# Patient Record
Sex: Female | Born: 1935 | Race: White | Hispanic: No | Smoking: Never smoker
Health system: Southern US, Community
[De-identification: ages and names within clinical notes are randomized; demographics above are authoritative.]

## PROBLEM LIST (undated history)

## (undated) DIAGNOSIS — M810 Age-related osteoporosis without current pathological fracture: Secondary | ICD-10-CM

## (undated) DIAGNOSIS — M353 Polymyalgia rheumatica: Secondary | ICD-10-CM

## (undated) HISTORY — PX: ABDOMINAL HYSTERECTOMY: SHX81

## (undated) HISTORY — DX: Polymyalgia rheumatica: M35.3

## (undated) HISTORY — DX: Age-related osteoporosis without current pathological fracture: M81.0

---

## 2004-11-02 ENCOUNTER — Ambulatory Visit: Payer: Self-pay | Admitting: Internal Medicine

## 2004-11-04 ENCOUNTER — Ambulatory Visit: Payer: Self-pay | Admitting: Internal Medicine

## 2004-11-09 ENCOUNTER — Ambulatory Visit: Payer: Self-pay | Admitting: Internal Medicine

## 2005-03-04 ENCOUNTER — Ambulatory Visit: Payer: Self-pay | Admitting: Internal Medicine

## 2005-03-07 ENCOUNTER — Ambulatory Visit: Payer: Self-pay | Admitting: Internal Medicine

## 2012-09-05 ENCOUNTER — Ambulatory Visit (HOSPITAL_COMMUNITY)
Admission: RE | Admit: 2012-09-05 | Discharge: 2012-09-05 | Disposition: A | Discharge status: Home / Self Care | Payer: Medicare Other | Source: Ambulatory Visit | Attending: Rheumatology | Admitting: Rheumatology

## 2012-09-05 ENCOUNTER — Other Ambulatory Visit (HOSPITAL_COMMUNITY): Payer: Self-pay | Admitting: Rheumatology

## 2012-09-05 DIAGNOSIS — Z79899 Other long term (current) drug therapy: Secondary | ICD-10-CM | POA: Insufficient documentation

## 2012-09-05 DIAGNOSIS — Z5189 Encounter for other specified aftercare: Secondary | ICD-10-CM

## 2012-09-11 ENCOUNTER — Other Ambulatory Visit (HOSPITAL_BASED_OUTPATIENT_CLINIC_OR_DEPARTMENT_OTHER): Payer: Self-pay | Admitting: Rheumatology

## 2012-09-11 DIAGNOSIS — R700 Elevated erythrocyte sedimentation rate: Secondary | ICD-10-CM

## 2012-09-15 ENCOUNTER — Ambulatory Visit (HOSPITAL_BASED_OUTPATIENT_CLINIC_OR_DEPARTMENT_OTHER)
Admission: RE | Admit: 2012-09-15 | Discharge: 2012-09-15 | Disposition: A | Discharge status: Home / Self Care | Payer: Medicare Other | Source: Ambulatory Visit | Attending: Rheumatology | Admitting: Rheumatology

## 2012-09-15 ENCOUNTER — Emergency Department (HOSPITAL_BASED_OUTPATIENT_CLINIC_OR_DEPARTMENT_OTHER)
Admission: EM | Admit: 2012-09-15 | Discharge: 2012-09-15 | Discharge status: Against Medical Advice | Payer: Medicare Other

## 2012-09-15 DIAGNOSIS — N281 Cyst of kidney, acquired: Secondary | ICD-10-CM | POA: Insufficient documentation

## 2012-09-15 DIAGNOSIS — I700 Atherosclerosis of aorta: Secondary | ICD-10-CM | POA: Insufficient documentation

## 2012-09-15 DIAGNOSIS — J9819 Other pulmonary collapse: Secondary | ICD-10-CM | POA: Insufficient documentation

## 2012-09-15 DIAGNOSIS — Z90710 Acquired absence of both cervix and uterus: Secondary | ICD-10-CM | POA: Insufficient documentation

## 2012-09-15 DIAGNOSIS — K5730 Diverticulosis of large intestine without perforation or abscess without bleeding: Secondary | ICD-10-CM | POA: Insufficient documentation

## 2012-09-15 DIAGNOSIS — R700 Elevated erythrocyte sedimentation rate: Secondary | ICD-10-CM

## 2012-09-15 MED ORDER — IOHEXOL 300 MG/ML  SOLN
100.0000 mL | Freq: Once | INTRAMUSCULAR | Status: AC | PRN
  Administered 2012-09-15: 100 mL via INTRAVENOUS

## 2013-12-03 ENCOUNTER — Telehealth: Payer: Self-pay | Admitting: Hematology and Oncology

## 2013-12-03 NOTE — Telephone Encounter (Signed)
LEFT MESSAGE FOR PATIENT AND GAVE NP APPT FOR 10/26 @ 9:45 W/DR. Strang. CONTACT INFORMATION WAS LEFT FOR PATIENT TO RETURN CALL ON CONFIRM MESSAGE/NP APPT.

## 2013-12-16 ENCOUNTER — Encounter (INDEPENDENT_AMBULATORY_CARE_PROVIDER_SITE_OTHER): Payer: Self-pay

## 2013-12-16 ENCOUNTER — Encounter: Payer: Self-pay | Admitting: Hematology and Oncology

## 2013-12-16 ENCOUNTER — Ambulatory Visit (HOSPITAL_BASED_OUTPATIENT_CLINIC_OR_DEPARTMENT_OTHER): Payer: Commercial Managed Care - HMO | Admitting: Hematology and Oncology

## 2013-12-16 ENCOUNTER — Telehealth: Payer: Self-pay | Admitting: Hematology and Oncology

## 2013-12-16 ENCOUNTER — Ambulatory Visit: Payer: Commercial Managed Care - HMO

## 2013-12-16 VITALS — BP 147/66 | HR 75 | Temp 98.1°F | Resp 18 | Ht 60.0 in | Wt 111.7 lb

## 2013-12-16 DIAGNOSIS — D472 Monoclonal gammopathy: Secondary | ICD-10-CM

## 2013-12-16 DIAGNOSIS — M353 Polymyalgia rheumatica: Secondary | ICD-10-CM

## 2013-12-16 NOTE — Assessment & Plan Note (Addendum)
We discussed the importance of blood work, 24 hour urine collection and skeletal survey to be done at the end of the year rather than now. She has no clinical end organ damage. I will see her back after further tests results are available

## 2013-12-16 NOTE — Telephone Encounter (Signed)
Gave AVS & Cal for Jan. Also # for Xray if not contacted.

## 2013-12-16 NOTE — Progress Notes (Signed)
[facility] NOTE  Patient Care Team: Lynne Logan, MD as PCP - General (Family Medicine) Bo Merino, MD as Consulting Physician (Rheumatology) Heath Lark, MD as Consulting Physician (Hematology and Oncology)  CHIEF COMPLAINTS/PURPOSE OF CONSULTATION:  Abnormal M spike  HISTORY OF PRESENTING ILLNESS:  Stacey Merritt 80 y.o. female is here because of M spike detected on most recent blood work by her rheumatologist. She has background diagnosis of polymyalgia rheumatica and was placed on prednisone along with Plaquenil. The patient had chronic osteoporosis on Prolia injection. She denies history of abnormal bone pain or bone fracture. Patient denies history of recurrent infection or atypical infections such as shingles of meningitis. Denies chills, night sweats, anorexia or abnormal weight loss.  MEDICAL HISTORY:  Past Medical History  Diagnosis Date  . Polymyalgia rheumatica   . Osteoporosis     SURGICAL HISTORY: Past Surgical History  Procedure Laterality Date  . Abdominal hysterectomy      SOCIAL HISTORY: History   Social History  . Marital Status: Married    Spouse Name: N/A    Number of Children: N/A  . Years of Education: N/A   Occupational History  . Not on file.   Social History Main Topics  . Smoking status: Never Smoker   . Smokeless tobacco: Never Used  . Alcohol Use: No  . Drug Use: No  . Sexual Activity: Not on file   Other Topics Concern  . Not on file   Social History Narrative  . No narrative on file    FAMILY HISTORY: History reviewed. No pertinent family history.  ALLERGIES:  is allergic to celebrex and codeine.  MEDICATIONS:  Current Outpatient Prescriptions  Medication Sig Dispense Refill  . denosumab (PROLIA) 60 MG/ML SOLN injection Inject into the skin every 6 (six) months. Administer in upper arm, thigh, or abdomen      . hydroxychloroquine (PLAQUENIL) 200 MG tablet Take 200 mg by mouth daily.      .  predniSONE (DELTASONE) 5 MG tablet Take 8 mg by mouth daily with breakfast.       No current facility-administered medications for this visit.    REVIEW OF SYSTEMS:   Eyes: Denies blurriness of vision, double vision or watery eyes Ears, nose, mouth, throat, and face: Denies mucositis or sore throat Respiratory: Denies cough, dyspnea or wheezes Cardiovascular: Denies palpitation, chest discomfort or lower extremity swelling Gastrointestinal:  Denies nausea, heartburn or change in bowel habits Skin: Denies abnormal skin rashes Lymphatics: Denies new lymphadenopathy or easy bruising Neurological:Denies numbness, tingling or new weaknesses Behavioral/Psych: Mood is stable, no new changes  All other systems were reviewed with the patient and are negative.  PHYSICAL EXAMINATION: ECOG PERFORMANCE STATUS: 0 - Asymptomatic  Filed Vitals:   12/16/13 0941  BP: 147/66  Pulse: 75  Temp: 98.1 F (36.7 C)  Resp: 18   Filed Weights   12/16/13 0941  Weight: 111 lb 11.2 oz (50.667 kg)    GENERAL:alert, no distress and comfortable she looks thin and cachectic SKIN: skin color, texture, turgor are normal, no rashes or significant lesions EYES: normal, conjunctiva are pink and non-injected, sclera clear OROPHARYNX:no exudate, no erythema and lips, buccal mucosa, and tongue normal  NECK: supple, thyroid normal size, non-tender, without nodularity LYMPH:  no palpable lymphadenopathy in the cervical, axillary or inguinal LUNGS: clear to auscultation and percussion with normal breathing effort HEART: regular rate & rhythm and no murmurs and no lower extremity edema ABDOMEN:abdomen soft, non-tender and normal  bowel sounds Musculoskeletal:no cyanosis of digits and no clubbing  PSYCH: alert & oriented x 3 with fluent speech NEURO: no focal motor/sensory deficits  LABORATORY DATA: I review her outside blood work from September 2015   ASSESSMENT & PLAN:  MGUS (monoclonal gammopathy of unknown  significance) We discussed the importance of blood work, 24 hour urine collection and skeletal survey to be done at the end of the year rather than now. She has no clinical end organ damage. I will see her back after further tests results are available  Polymyalgia rheumatica She will continue management via her rheumatologist    Orders Placed This Encounter  Procedures  . DG Bone Survey Met    Standing Status: Future     Number of Occurrences:      Standing Expiration Date: 02/15/2015    Order Specific Question:  Reason for Exam (SYMPTOM  OR DIAGNOSIS REQUIRED)    Answer:  staging myeloma    Order Specific Question:  Preferred imaging location?    Answer:  [facility]  . CBC with Differential    Standing Status: Future     Number of Occurrences:      Standing Expiration Date: 02/15/2015  . Comprehensive metabolic panel    Standing Status: Future     Number of Occurrences:      Standing Expiration Date: 02/15/2015  . Lactate dehydrogenase    Standing Status: Future     Number of Occurrences:      Standing Expiration Date: 02/15/2015  . SPEP & IFE with QIG    Standing Status: Future     Number of Occurrences:      Standing Expiration Date: 02/15/2015  . Kappa/lambda light chains    Standing Status: Future     Number of Occurrences:      Standing Expiration Date: 02/15/2015  . Protein Electro, 24-Hour Urine    Standing Status: Future     Number of Occurrences:      Standing Expiration Date: 02/15/2015  . Beta 2 microglobulin, serum    Standing Status: Future     Number of Occurrences:      Standing Expiration Date: 02/15/2015  . Immunofixation interpretive, urine    Standing Status: Future     Number of Occurrences:      Standing Expiration Date: 02/15/2015  . IFE, Urine (with Tot Prot)    Standing Status: Future     Number of Occurrences:      Standing Expiration Date: 02/15/2015    All questions were answered. The patient knows to call the clinic  with any problems, questions or concerns. I spent 40 minutes counseling the patient face to face. The total time spent in the appointment was 55 minutes and more than 50% was on counseling.     [place], Hickman, MD 12/16/2013 12:40 PM

## 2013-12-16 NOTE — Assessment & Plan Note (Signed)
She will continue management via her rheumatologist

## 2013-12-16 NOTE — Progress Notes (Signed)
Checked in new pt with no financial concerns.  Pt is here for a hematology concern so no financial assistance may be needed but she has Raquel's card for any questions or concerns.

## 2014-02-24 ENCOUNTER — Ambulatory Visit (HOSPITAL_COMMUNITY): Payer: Commercial Managed Care - HMO

## 2014-02-24 ENCOUNTER — Other Ambulatory Visit (HOSPITAL_BASED_OUTPATIENT_CLINIC_OR_DEPARTMENT_OTHER): Payer: Medicare Other

## 2014-02-24 ENCOUNTER — Ambulatory Visit (HOSPITAL_COMMUNITY)
Admission: RE | Admit: 2014-02-24 | Discharge: 2014-02-24 | Disposition: A | Discharge status: Home / Self Care | Payer: Medicare Other | Source: Ambulatory Visit | Attending: Hematology and Oncology | Admitting: Hematology and Oncology

## 2014-02-24 DIAGNOSIS — D472 Monoclonal gammopathy: Secondary | ICD-10-CM

## 2014-02-24 DIAGNOSIS — M353 Polymyalgia rheumatica: Secondary | ICD-10-CM | POA: Insufficient documentation

## 2014-02-24 DIAGNOSIS — M5136 Other intervertebral disc degeneration, lumbar region: Secondary | ICD-10-CM | POA: Insufficient documentation

## 2014-02-24 DIAGNOSIS — M810 Age-related osteoporosis without current pathological fracture: Secondary | ICD-10-CM | POA: Diagnosis not present

## 2014-02-24 DIAGNOSIS — M5032 Other cervical disc degeneration, mid-cervical region: Secondary | ICD-10-CM | POA: Insufficient documentation

## 2014-02-24 LAB — CBC WITH DIFFERENTIAL/PLATELET
BASO%: 0.4 % (ref 0.0–2.0)
BASOS ABS: 0 10*3/uL (ref 0.0–0.1)
EOS%: 0 % (ref 0.0–7.0)
Eosinophils Absolute: 0 10*3/uL (ref 0.0–0.5)
HCT: 39.9 % (ref 34.8–46.6)
HGB: 13 g/dL (ref 11.6–15.9)
LYMPH%: 29.2 % (ref 14.0–49.7)
MCH: 30.5 pg (ref 25.1–34.0)
MCHC: 32.6 g/dL (ref 31.5–36.0)
MCV: 93.7 fL (ref 79.5–101.0)
MONO#: 0.5 10*3/uL (ref 0.1–0.9)
MONO%: 6.9 % (ref 0.0–14.0)
NEUT%: 63.5 % (ref 38.4–76.8)
NEUTROS ABS: 4.4 10*3/uL (ref 1.5–6.5)
PLATELETS: 259 10*3/uL (ref 145–400)
RBC: 4.26 10*6/uL (ref 3.70–5.45)
RDW: 14.9 % — ABNORMAL HIGH (ref 11.2–14.5)
WBC: 7 10*3/uL (ref 3.9–10.3)
lymph#: 2 10*3/uL (ref 0.9–3.3)

## 2014-02-24 LAB — COMPREHENSIVE METABOLIC PANEL (CC13)
ALBUMIN: 4.1 g/dL (ref 3.5–5.0)
ALK PHOS: 52 U/L (ref 40–150)
ALT: 20 U/L (ref 0–55)
AST: 22 U/L (ref 5–34)
Anion Gap: 12 mEq/L — ABNORMAL HIGH (ref 3–11)
BUN: 19.3 mg/dL (ref 7.0–26.0)
CALCIUM: 9.5 mg/dL (ref 8.4–10.4)
CO2: 27 mEq/L (ref 22–29)
Chloride: 102 mEq/L (ref 98–109)
Creatinine: 0.8 mg/dL (ref 0.6–1.1)
EGFR: 66 mL/min/{1.73_m2} — ABNORMAL LOW (ref >90)
Glucose: 88 mg/dl (ref 70–140)
Potassium: 3.7 mEq/L (ref 3.5–5.1)
SODIUM: 141 meq/L (ref 136–145)
Total Bilirubin: 0.41 mg/dL (ref 0.20–1.20)
Total Protein: 7.4 g/dL (ref 6.4–8.3)

## 2014-02-24 LAB — LACTATE DEHYDROGENASE (CC13): LDH: 265 U/L — AB (ref 125–245)

## 2014-02-26 LAB — KAPPA/LAMBDA LIGHT CHAINS
KAPPA FREE LGHT CHN: 0.2 mg/dL — AB (ref 0.33–1.94)
KAPPA LAMBDA RATIO: 0.25 — AB (ref 0.26–1.65)
LAMBDA FREE LGHT CHN: 0.81 mg/dL (ref 0.57–2.63)

## 2014-02-26 LAB — SPEP & IFE WITH QIG
ALPHA-2-GLOBULIN: 11.4 % (ref 7.1–11.8)
Albumin ELP: 61.4 % (ref 55.8–66.1)
Alpha-1-Globulin: 4.6 % (ref 2.9–4.9)
Beta 2: 4.1 % (ref 3.2–6.5)
Beta Globulin: 6.6 % (ref 4.7–7.2)
GAMMA GLOBULIN: 11.9 % (ref 11.1–18.8)
IGM, SERUM: 90 mg/dL (ref 52–322)
IgA: 106 mg/dL (ref 69–380)
IgG (Immunoglobin G), Serum: 890 mg/dL (ref 690–1700)
TOTAL PROTEIN, SERUM ELECTROPHOR: 7 g/dL (ref 6.0–8.3)

## 2014-02-26 LAB — BETA 2 MICROGLOBULIN, SERUM: BETA 2 MICROGLOBULIN: 2.41 mg/L (ref <=2.51)

## 2014-03-01 LAB — 24 HR URINE,KAPPA/LAMBDA LIGHT CHAINS
Measured Kappa Chain: <0.4 mg/dL (ref <2.00)
Measured Lambda Chain: <0.4 mg/dL (ref <2.00)
URINE VOLUME: 900 mL/(24.h)

## 2014-03-01 LAB — UIFE/LIGHT CHAINS/TP QN, 24-HR UR
Albumin, U: DETECTED
Time: 24 hours
Total Protein, Urine-Ur/day: 45 mg/d (ref <150)
Total Protein, Urine: 5 mg/dL (ref 5–24)
VOLUME, URINE-UPE24: 900 mL

## 2014-03-07 ENCOUNTER — Ambulatory Visit (HOSPITAL_BASED_OUTPATIENT_CLINIC_OR_DEPARTMENT_OTHER): Payer: Medicare Other | Admitting: Hematology and Oncology

## 2014-03-07 ENCOUNTER — Encounter: Payer: Self-pay | Admitting: Hematology and Oncology

## 2014-03-07 ENCOUNTER — Telehealth: Payer: Self-pay | Admitting: Hematology and Oncology

## 2014-03-07 VITALS — BP 150/67 | HR 69 | Temp 97.8°F | Resp 18 | Ht 60.0 in | Wt 114.1 lb

## 2014-03-07 DIAGNOSIS — D472 Monoclonal gammopathy: Secondary | ICD-10-CM

## 2014-03-07 DIAGNOSIS — M810 Age-related osteoporosis without current pathological fracture: Secondary | ICD-10-CM

## 2014-03-07 NOTE — Telephone Encounter (Signed)
Pt confirmed labs/ov per 01/15 POF, gave pt AVS.... KJ

## 2014-03-07 NOTE — Progress Notes (Signed)
[place] OFFICE PROGRESS NOTE  Patient Care Team: Lynne Logan, MD as PCP - General (Family Medicine) Bo Merino, MD as Consulting Physician (Rheumatology) Heath Lark, MD as Consulting Physician (Hematology and Oncology)  SUMMARY OF ONCOLOGIC HISTORY: Stacey Merritt 81 y.o. female is here because of M spike detected on most recent blood work by her rheumatologist. She has background diagnosis of polymyalgia rheumatica and was placed on prednisone along with Plaquenil. The patient had chronic osteoporosis on Prolia injection. She denies history of abnormal bone pain or bone fracture. Patient denies history of recurrent infection or atypical infections such as shingles of meningitis. Denies chills, night sweats, anorexia or abnormal weight loss. In January 2016, repeat blood work, 24-hour urine collection and skeletal survey were normal  INTERVAL HISTORY: Please see below for problem oriented charting. She feels well. She continued to have intermittent musculoskeletal pain especially over her right shoulder  REVIEW OF SYSTEMS:   Constitutional: Denies fevers, chills or abnormal weight loss Eyes: Denies blurriness of vision Ears, nose, mouth, throat, and face: Denies mucositis or sore throat Respiratory: Denies cough, dyspnea or wheezes Cardiovascular: Denies palpitation, chest discomfort or lower extremity swelling Gastrointestinal:  Denies nausea, heartburn or change in bowel habits Skin: Denies abnormal skin rashes Lymphatics: Denies new lymphadenopathy or easy bruising Neurological:Denies numbness, tingling or new weaknesses Behavioral/Psych: Mood is stable, no new changes  All other systems were reviewed with the patient and are negative.  I have reviewed the past medical history, past surgical history, social history and family history with the patient and they are unchanged from previous note.  ALLERGIES:  is allergic to celebrex and  codeine.  MEDICATIONS:  Current Outpatient Prescriptions  Medication Sig Dispense Refill  . denosumab (PROLIA) 60 MG/ML SOLN injection Inject into the skin every 6 (six) months. Administer in upper arm, thigh, or abdomen    . hydroxychloroquine (PLAQUENIL) 200 MG tablet Take 200 mg by mouth daily.    . predniSONE (DELTASONE) 5 MG tablet Take 8 mg by mouth daily with breakfast.     No current facility-administered medications for this visit.    PHYSICAL EXAMINATION: ECOG PERFORMANCE STATUS: 0 - Asymptomatic  Filed Vitals:   03/07/14 1048  BP: 150/67  Pulse: 69  Temp: 97.8 F (36.6 C)  Resp: 18   Filed Weights   03/07/14 1048  Weight: 114 lb 1.6 oz (51.755 kg)    GENERAL:alert, no distress and comfortable SKIN: skin color, texture, turgor are normal, no rashes or significant lesions EYES: normal, Conjunctiva are pink and non-injected, sclera clear Musculoskeletal:no cyanosis of digits and no clubbing  NEURO: alert & oriented x 3 with fluent speech, no focal motor/sensory deficits  LABORATORY DATA:  I have reviewed the data as listed    Component Value Date/Time   NA 141 02/24/2014 1144   K 3.7 02/24/2014 1144   CO2 27 02/24/2014 1144   GLUCOSE 88 02/24/2014 1144   BUN 19.3 02/24/2014 1144   CREATININE 0.8 02/24/2014 1144   CALCIUM 9.5 02/24/2014 1144   PROT 7.4 02/24/2014 1144   ALBUMIN 4.1 02/24/2014 1144   AST 22 02/24/2014 1144   ALT 20 02/24/2014 1144   ALKPHOS 52 02/24/2014 1144   BILITOT 0.41 02/24/2014 1144    No results found for: SPEP, UPEP  Lab Results  Component Value Date   WBC 7.0 02/24/2014   NEUTROABS 4.4 02/24/2014   HGB 13.0 02/24/2014   HCT 39.9 02/24/2014   MCV 93.7 02/24/2014  PLT 259 02/24/2014      Chemistry      Component Value Date/Time   NA 141 02/24/2014 1144   K 3.7 02/24/2014 1144   CO2 27 02/24/2014 1144   BUN 19.3 02/24/2014 1144   CREATININE 0.8 02/24/2014 1144      Component Value Date/Time   CALCIUM 9.5  02/24/2014 1144   ALKPHOS 52 02/24/2014 1144   AST 22 02/24/2014 1144   ALT 20 02/24/2014 1144   BILITOT 0.41 02/24/2014 1144       RADIOGRAPHIC STUDIES: I reviewed the most recent skeletal survey with her and her daughter I have personally reviewed the radiological images as listed and agreed with the findings in the report.    ASSESSMENT & PLAN:  MGUS (monoclonal gammopathy of unknown significance) All her repeat blood work, urine tests and skeletal survey did not confirm presence of monoclonal paraproteinemia. However, this was detected last year by her rheumatologist. Recommend recheck blood work and imaging study a year from now and she agreed.   Osteoporosis On the skeletal survey, it detected a small lesion in the humerus. I reviewed it myself and I do not think this is a lytic lesion. I noted she has significant osteoporosis from long-term prednisone therapy. Recommend she increase the dose of vitamin D and calcium supplements. She is getting Prolia on the regular basis    Orders Placed This Encounter  Procedures  . DG Bone Survey Met    Standing Status: Future     Number of Occurrences:      Standing Expiration Date: 05/07/2015    Order Specific Question:  Reason for Exam (SYMPTOM  OR DIAGNOSIS REQUIRED)    Answer:  staging myeloma    Order Specific Question:  Preferred imaging location?    Answer:  [facility]  . CBC with Differential    Standing Status: Future     Number of Occurrences:      Standing Expiration Date: 05/07/2015  . Comprehensive metabolic panel    Standing Status: Future     Number of Occurrences:      Standing Expiration Date: 05/07/2015  . Lactate dehydrogenase    Standing Status: Future     Number of Occurrences:      Standing Expiration Date: 05/07/2015  . SPEP & IFE with QIG    Standing Status: Future     Number of Occurrences:      Standing Expiration Date: 05/07/2015  . Kappa/lambda light chains    Standing Status: Future      Number of Occurrences:      Standing Expiration Date: 05/07/2015  . Beta 2 microglobulin, serum    Standing Status: Future     Number of Occurrences:      Standing Expiration Date: 05/07/2015   All questions were answered. The patient knows to call the clinic with any problems, questions or concerns. No barriers to learning was detected. I spent 15 minutes counseling the patient face to face. The total time spent in the appointment was 20 minutes and more than 50% was on counseling and review of test results     [facility], [place], MD 03/07/2014 4:51 PM

## 2014-03-07 NOTE — Assessment & Plan Note (Signed)
On the skeletal survey, it detected a small lesion in the humerus. I reviewed it myself and I do not think this is a lytic lesion. I noted she has significant osteoporosis from long-term prednisone therapy. Recommend she increase the dose of vitamin D and calcium supplements. She is getting Prolia on the regular basis

## 2014-03-07 NOTE — Assessment & Plan Note (Signed)
All her repeat blood work, urine tests and skeletal survey did not confirm presence of monoclonal paraproteinemia. However, this was detected last year by her rheumatologist. Recommend recheck blood work and imaging study a year from now and she agreed.

## 2014-08-21 ENCOUNTER — Encounter: Payer: Self-pay | Admitting: Internal Medicine

## 2015-02-24 ENCOUNTER — Ambulatory Visit (HOSPITAL_COMMUNITY)
Admission: RE | Admit: 2015-02-24 | Discharge: 2015-02-24 | Disposition: A | Discharge status: Home / Self Care | Payer: Medicare Other | Source: Ambulatory Visit | Attending: Hematology and Oncology | Admitting: Hematology and Oncology

## 2015-02-24 DIAGNOSIS — I700 Atherosclerosis of aorta: Secondary | ICD-10-CM | POA: Diagnosis not present

## 2015-02-24 DIAGNOSIS — M899 Disorder of bone, unspecified: Secondary | ICD-10-CM | POA: Diagnosis not present

## 2015-02-24 DIAGNOSIS — D472 Monoclonal gammopathy: Secondary | ICD-10-CM | POA: Insufficient documentation

## 2015-02-24 DIAGNOSIS — M8580 Other specified disorders of bone density and structure, unspecified site: Secondary | ICD-10-CM | POA: Diagnosis not present

## 2015-02-26 ENCOUNTER — Telehealth: Payer: Self-pay | Admitting: Hematology and Oncology

## 2015-02-26 ENCOUNTER — Telehealth: Payer: Self-pay | Admitting: *Deleted

## 2015-02-26 ENCOUNTER — Other Ambulatory Visit: Payer: Medicare Other

## 2015-02-26 NOTE — Telephone Encounter (Signed)
Returned patient call re r/s 1/5 and 1/12 appointments. Per patient she needs appointments on a Tuesday. Gave patient new appointments for 1/10 and 1/17.

## 2015-02-26 NOTE — Telephone Encounter (Signed)
Pt called to r/s her lab and MD visit.  States she cannot come on Thursdays and needs to r/s to Tuesdays d/t caregiving needs of her husband who has Alzheimers.  Informed pt we can move her lab to 1/10 and MD visit to 1/17 and she also requests between 10 am and 12 pm if possible.  POF sent to scheduler and informed pt Scheduler will call her w/ new appt date/ time.

## 2015-03-03 ENCOUNTER — Ambulatory Visit (HOSPITAL_BASED_OUTPATIENT_CLINIC_OR_DEPARTMENT_OTHER): Payer: Medicare Other

## 2015-03-03 DIAGNOSIS — D472 Monoclonal gammopathy: Secondary | ICD-10-CM

## 2015-03-03 LAB — CBC WITH DIFFERENTIAL/PLATELET
BASO%: 0.4 % (ref 0.0–2.0)
Basophils Absolute: 0 10*3/uL (ref 0.0–0.1)
EOS ABS: 0.2 10*3/uL (ref 0.0–0.5)
EOS%: 3.1 % (ref 0.0–7.0)
HEMATOCRIT: 37.8 % (ref 34.8–46.6)
HEMOGLOBIN: 12.7 g/dL (ref 11.6–15.9)
LYMPH%: 33.1 % (ref 14.0–49.7)
MCH: 31.9 pg (ref 25.1–34.0)
MCHC: 33.6 g/dL (ref 31.5–36.0)
MCV: 95 fL (ref 79.5–101.0)
MONO#: 0.5 10*3/uL (ref 0.1–0.9)
MONO%: 9.9 % (ref 0.0–14.0)
NEUT#: 2.6 10*3/uL (ref 1.5–6.5)
NEUT%: 53.5 % (ref 38.4–76.8)
Platelets: 205 10*3/uL (ref 145–400)
RBC: 3.98 10*6/uL (ref 3.70–5.45)
RDW: 12.2 % (ref 11.2–14.5)
WBC: 4.9 10*3/uL (ref 3.9–10.3)
lymph#: 1.6 10*3/uL (ref 0.9–3.3)

## 2015-03-03 LAB — COMPREHENSIVE METABOLIC PANEL
ALBUMIN: 3.9 g/dL (ref 3.5–5.0)
ALK PHOS: 51 U/L (ref 40–150)
ALT: 14 U/L (ref 0–55)
AST: 20 U/L (ref 5–34)
Anion Gap: 9 mEq/L (ref 3–11)
BILIRUBIN TOTAL: 0.46 mg/dL (ref 0.20–1.20)
BUN: 20.9 mg/dL (ref 7.0–26.0)
CO2: 27 mEq/L (ref 22–29)
Calcium: 9.8 mg/dL (ref 8.4–10.4)
Chloride: 105 mEq/L (ref 98–109)
Creatinine: 0.8 mg/dL (ref 0.6–1.1)
EGFR: 70 mL/min/{1.73_m2} — ABNORMAL LOW (ref >90)
GLUCOSE: 89 mg/dL (ref 70–140)
Potassium: 4.2 mEq/L (ref 3.5–5.1)
SODIUM: 140 meq/L (ref 136–145)
TOTAL PROTEIN: 7.1 g/dL (ref 6.4–8.3)

## 2015-03-03 LAB — LACTATE DEHYDROGENASE: LDH: 241 U/L (ref 125–245)

## 2015-03-04 LAB — KAPPA/LAMBDA LIGHT CHAINS
IG LAMBDA FREE LIGHT CHAIN: 11.21 mg/L (ref 5.71–26.30)
Ig Kappa Free Light Chain: 12.18 mg/L (ref 3.30–19.40)
KAPPA/LAMBDA FLC RATIO: 1.09 (ref 0.26–1.65)

## 2015-03-04 LAB — BETA 2 MICROGLOBULIN, SERUM: BETA 2: 1.9 mg/L (ref 0.6–2.4)

## 2015-03-05 ENCOUNTER — Ambulatory Visit: Payer: Medicare Other | Admitting: Hematology and Oncology

## 2015-03-10 ENCOUNTER — Ambulatory Visit (HOSPITAL_BASED_OUTPATIENT_CLINIC_OR_DEPARTMENT_OTHER): Payer: Medicare Other | Admitting: Hematology and Oncology

## 2015-03-10 ENCOUNTER — Encounter: Payer: Self-pay | Admitting: Hematology and Oncology

## 2015-03-10 VITALS — BP 124/67 | HR 63 | Temp 98.0°F | Resp 18 | Ht 60.0 in | Wt 109.3 lb

## 2015-03-10 DIAGNOSIS — D472 Monoclonal gammopathy: Secondary | ICD-10-CM | POA: Diagnosis not present

## 2015-03-10 NOTE — Progress Notes (Signed)
[place] OFFICE PROGRESS NOTE  Patient Care Team: Donald Prose, MD as PCP - General (Family Medicine) Bo Merino, MD as Consulting Physician (Rheumatology) Heath Lark, MD as Consulting Physician (Hematology and Oncology)  SUMMARY OF ONCOLOGIC HISTORY:  Stacey Merritt is here because of M spike detected on most recent blood work by her rheumatologist. She has background diagnosis of polymyalgia rheumatica and was placed on prednisone along with Plaquenil. The patient had chronic osteoporosis on Prolia injection. She denies history of abnormal bone pain or bone fracture. Patient denies history of recurrent infection or atypical infections such as shingles of meningitis. Denies chills, night sweats, anorexia or abnormal weight loss. In January 2016, repeat blood work, 24-hour urine collection and skeletal survey were normal  INTERVAL HISTORY:  Please see below for problem oriented charting. She feels well. Denies any bone pain or recent bone fracture. She is off prednisone and is only taking medicine for osteoporosis.  REVIEW OF SYSTEMS:   Constitutional: Denies fevers, chills or abnormal weight loss Eyes: Denies blurriness of vision Ears, nose, mouth, throat, and face: Denies mucositis or sore throat Respiratory: Denies cough, dyspnea or wheezes Cardiovascular: Denies palpitation, chest discomfort or lower extremity swelling Gastrointestinal:  Denies nausea, heartburn or change in bowel habits Skin: Denies abnormal skin rashes Lymphatics: Denies new lymphadenopathy or easy bruising Neurological:Denies numbness, tingling or new weaknesses Behavioral/Psych: Mood is stable, no new changes  All other systems were reviewed with the patient and are negative.  I have reviewed the past medical history, past surgical history, social history and family history with the patient and they are unchanged from previous note.  ALLERGIES:  is allergic to celebrex and  codeine.  MEDICATIONS:  Current Outpatient Prescriptions  Medication Sig Dispense Refill  . denosumab (PROLIA) 60 MG/ML SOLN injection Inject into the skin every 6 (six) months. Administer in upper arm, thigh, or abdomen     No current facility-administered medications for this visit.    PHYSICAL EXAMINATION: ECOG PERFORMANCE STATUS: 0 - Asymptomatic  Filed Vitals:   03/10/15 1214  BP: 124/67  Pulse: 63  Temp: 98 F (36.7 C)  Resp: 18   Filed Weights   03/10/15 1214  Weight: 109 lb 4.8 oz (49.578 kg)    GENERAL:alert, no distress and comfortable SKIN: skin color, texture, turgor are normal, no rashes or significant lesions EYES: normal, Conjunctiva are pink and non-injected, sclera clear Musculoskeletal:no cyanosis of digits and no clubbing  NEURO: alert & oriented x 3 with fluent speech, no focal motor/sensory deficits  LABORATORY DATA:  I have reviewed the data as listed    Component Value Date/Time   NA 140 03/03/2015 1135   K 4.2 03/03/2015 1135   CO2 27 03/03/2015 1135   GLUCOSE 89 03/03/2015 1135   BUN 20.9 03/03/2015 1135   CREATININE 0.8 03/03/2015 1135   CALCIUM 9.8 03/03/2015 1135   PROT 7.1 03/03/2015 1135   ALBUMIN 3.9 03/03/2015 1135   AST 20 03/03/2015 1135   ALT 14 03/03/2015 1135   ALKPHOS 51 03/03/2015 1135   BILITOT 0.46 03/03/2015 1135    No results found for: SPEP, UPEP  Lab Results  Component Value Date   WBC 4.9 03/03/2015   NEUTROABS 2.6 03/03/2015   HGB 12.7 03/03/2015   HCT 37.8 03/03/2015   MCV 95.0 03/03/2015   PLT 205 03/03/2015      Chemistry      Component Value Date/Time   NA 140 03/03/2015 1135   K 4.2  03/03/2015 1135   CO2 27 03/03/2015 1135   BUN 20.9 03/03/2015 1135   CREATININE 0.8 03/03/2015 1135      Component Value Date/Time   CALCIUM 9.8 03/03/2015 1135   ALKPHOS 51 03/03/2015 1135   AST 20 03/03/2015 1135   ALT 14 03/03/2015 1135   BILITOT 0.46 03/03/2015 1135       RADIOGRAPHIC STUDIES:  Skeletal survey showed no evidence of lytic lesions I have personally reviewed the radiological images as listed and agreed with the findings in the report.    ASSESSMENT & PLAN:  MGUS (monoclonal gammopathy of unknown significance) All her repeat blood work, urine tests and skeletal survey did not confirm presence of monoclonal paraproteinemia. However, this was detected in 2015 by her rheumatologist. Recommend recheck blood work and imaging study recently were negative The final report from serum protein electrophoresis is still pending. I will call the patient with results. If repeat test confirm she have no evidence of monoclonal paraproteinemia, I will discharge her from the clinic. She agrees with the plan of care.    No orders of the defined types were placed in this encounter.   All questions were answered. The patient knows to call the clinic with any problems, questions or concerns. No barriers to learning was detected. I spent 15 minutes counseling the patient face to face. The total time spent in the appointment was 20 minutes and more than 50% was on counseling and review of test results     [facility], Mattie Novosel, MD 03/10/2015 1:13 PM

## 2015-03-10 NOTE — Assessment & Plan Note (Signed)
All her repeat blood work, urine tests and skeletal survey did not confirm presence of monoclonal paraproteinemia. However, this was detected in 2015 by her rheumatologist. Recommend recheck blood work and imaging study recently were negative The final report from serum protein electrophoresis is still pending. I will call the patient with results. If repeat test confirm she have no evidence of monoclonal paraproteinemia, I will discharge her from the clinic. She agrees with the plan of care.

## 2015-03-11 ENCOUNTER — Telehealth: Payer: Self-pay | Admitting: *Deleted

## 2015-03-11 NOTE — Telephone Encounter (Signed)
Informed pt that the rest of her labs came back normal per Dr. Alvy Bimler.  She verbalized understanding.

## 2015-03-17 LAB — IMMUNOFIXATION ELECTROPHORESIS
IgA, Qn, Serum: 83 mg/dL (ref 64–422)
IgG, Qn, Serum: 786 mg/dL (ref 700–1600)
IgM, Qn, Serum: 74 mg/dL (ref 26–217)

## 2016-01-19 IMAGING — CR DG BONE SURVEY MET
10 series · 10 of 10 positions shown · non-contrast
Comparison: 02/24/2014.  CT Abdomen and Pelvis 09/15/2012.

CLINICAL DATA: 81-year-old female with Monoclonal gammopathy of
unknown significance. Staging. Subsequent encounter.

EXAM:
METASTATIC BONE SURVEY

[w chest pa]
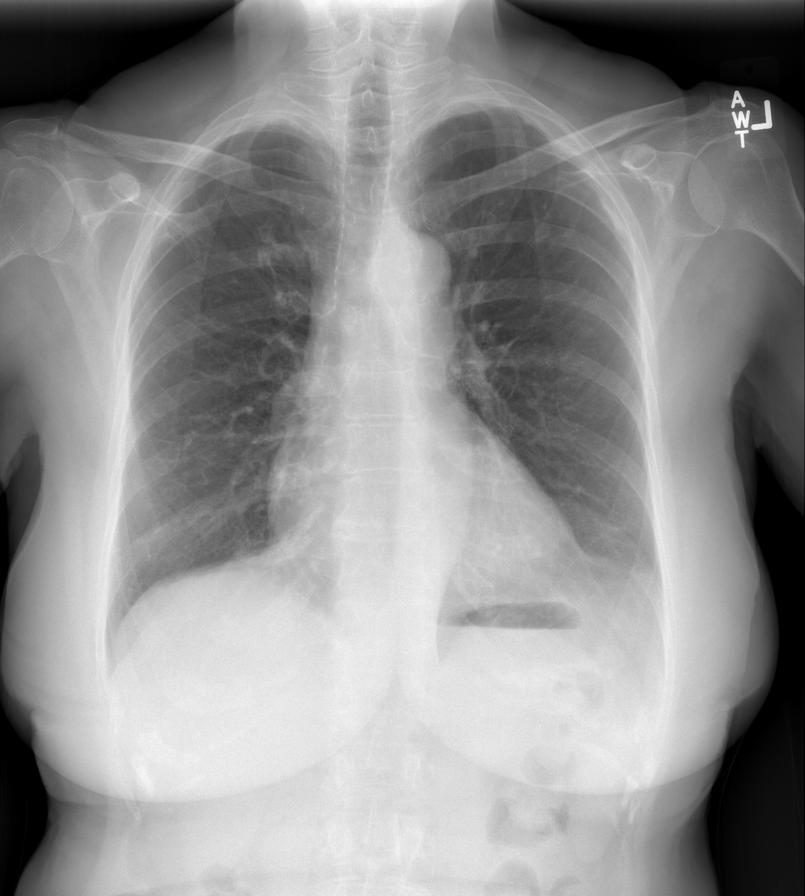

[w c-spine lat]
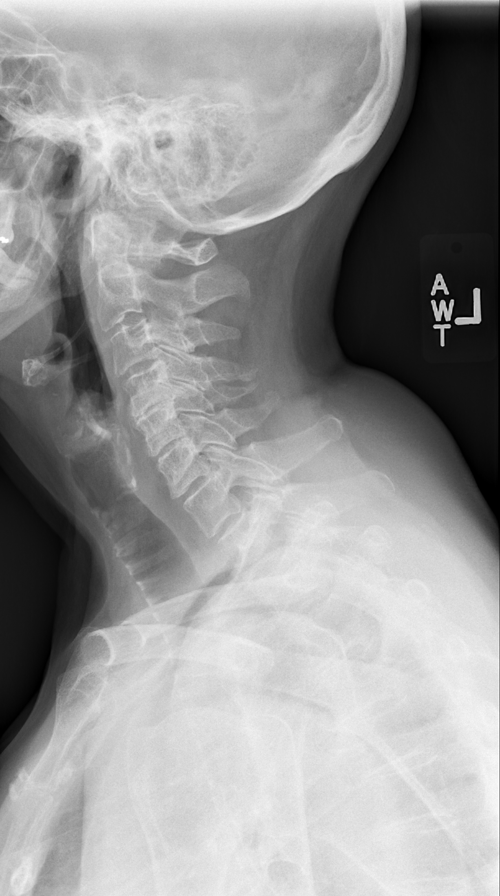

[w skull lat *]
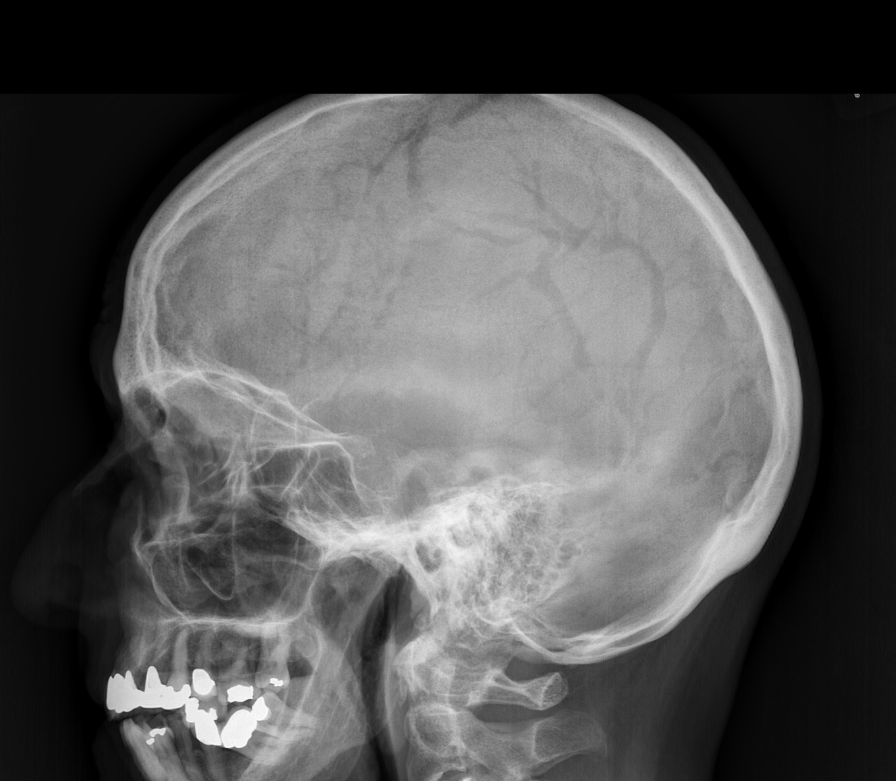

[t c-spine a.p.]
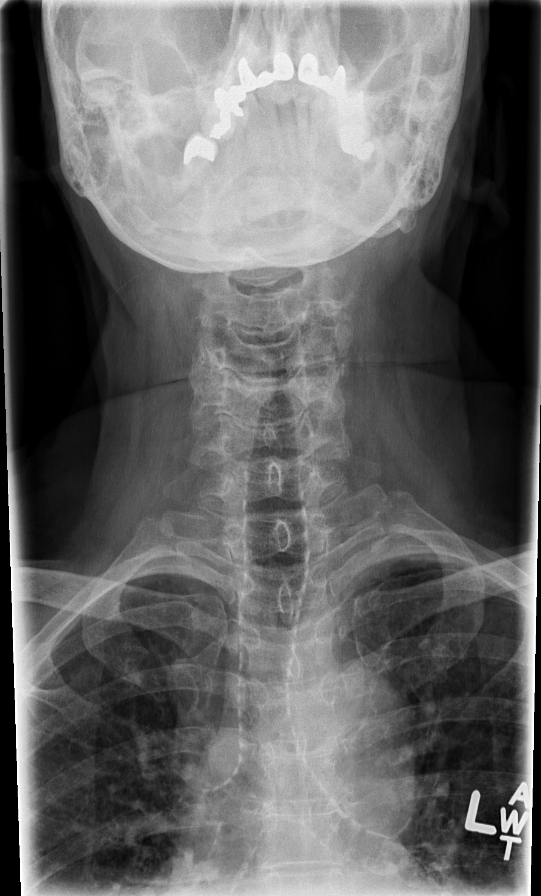

[t t-spine a.p.]
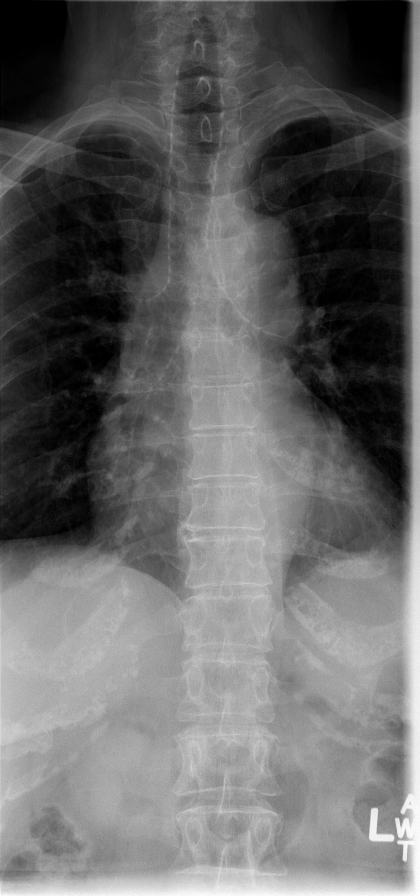

[t l-spine a.p.]
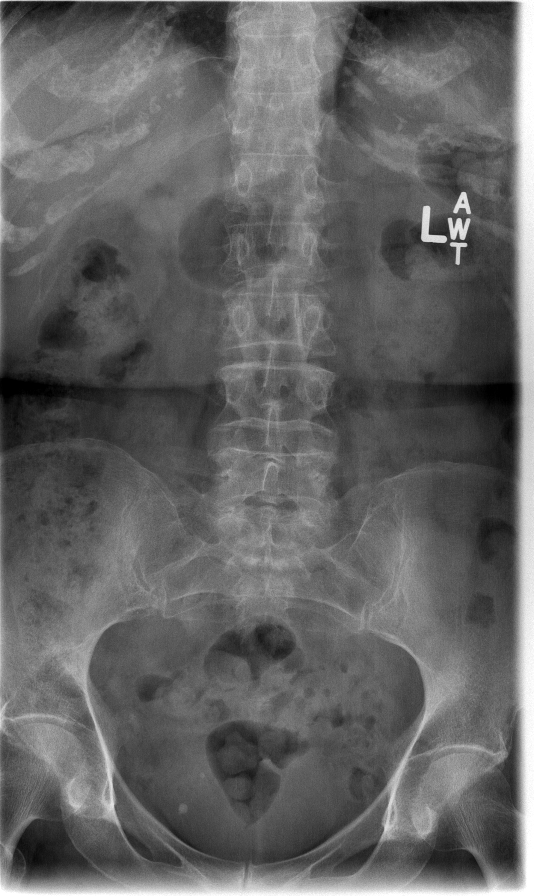

[t pelvis a.p.]
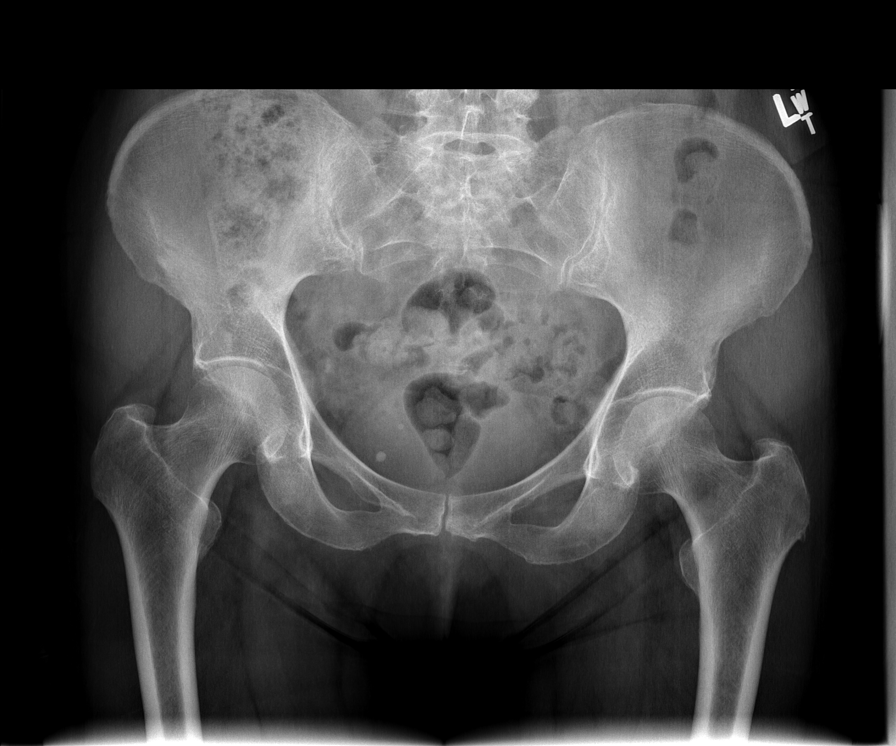

[t femur with hip  ap left]
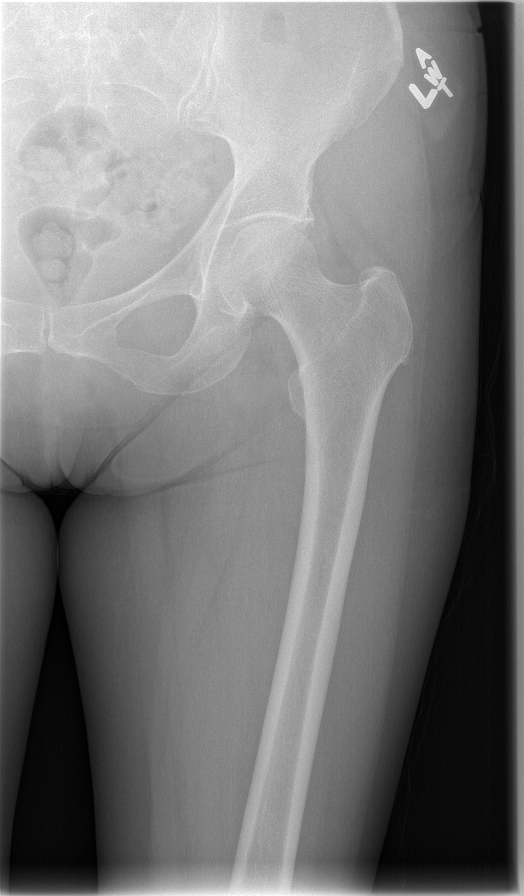

[t femur with knee ap left]
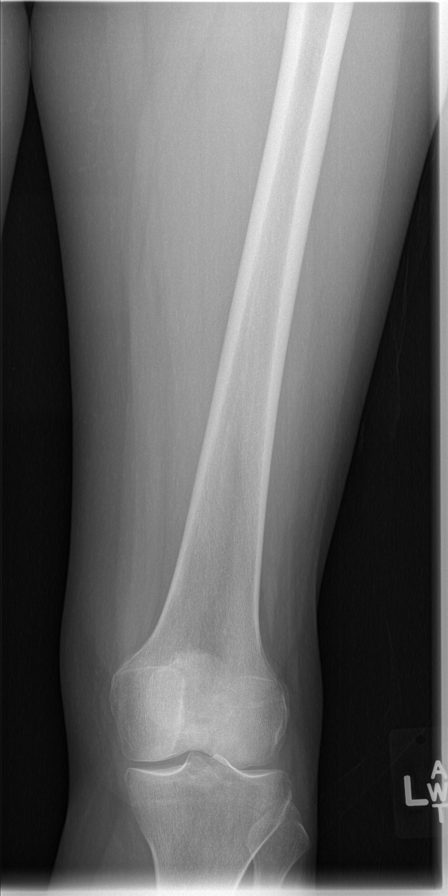

[t tib/fib ap left]
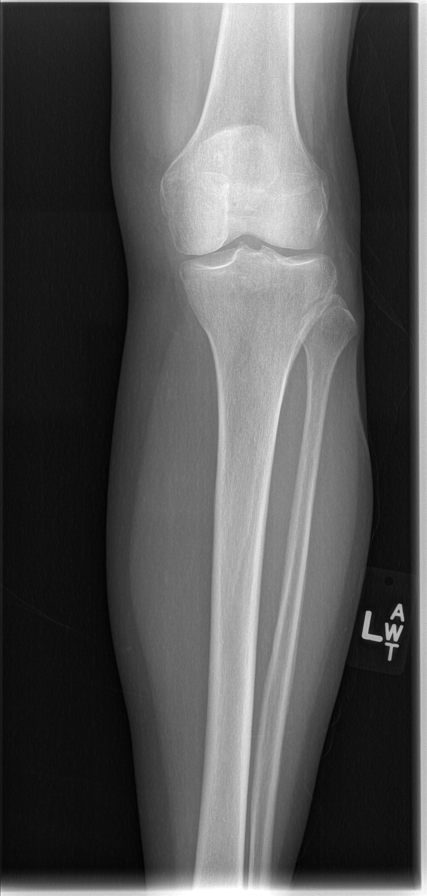

[10 of 10 positions shown; findings below may reference images not displayed]

FINDINGS: Calvarium bone mineralization remains normal.

Stable cervical vertebral height and alignment. Chronic disc and
endplate degeneration maximal at C4-C5. Cervicothoracic junction
alignment is within normal limits. Cervical spine bone
mineralization within normal limits.

Normal thoracic segmentation. Stable osteopenia in the thoracic
spine. Stable and normal thoracic vertebral height and alignment.

Stable lumbar vertebral height and alignment. Stable bone
mineralization, normal for age.

Calcified aortic atherosclerosis.

Pelvis bone mineralization stable and normal for age. Occasional
pelvic phleboliths are stable.

The previously described subtle proximal right humerus lucent area
is stable, and otherwise the bone mineralization of the visualized
bilateral appendicular skeleton is within normal limits.

Stable thoracic and abdominal visceral contours.
IMPRESSION: Stable skeletal survey which is negative aside from osteopenia in
the thoracic spine, and a small nonspecific 5 mm lucent area in the
proximal right humerus which is unchanged and favored to be benign.

## 2019-03-18 ENCOUNTER — Ambulatory Visit: Payer: Medicare Other | Attending: Internal Medicine

## 2019-03-18 DIAGNOSIS — Z23 Encounter for immunization: Secondary | ICD-10-CM | POA: Insufficient documentation

## 2019-03-18 NOTE — Progress Notes (Signed)
   Covid-19 Vaccination Clinic  Name:  [name]    MRN: [id number] DOB: 1933/05/29  03/18/2019  Ms. [name] was observed post Covid-19 immunization for 15 minutes without incidence. She was provided with Vaccine Information Sheet and instruction to access the V-Safe system.   [name] was instructed to call 911 with any severe reactions post vaccine: Marland Kitchen Difficulty breathing  . Swelling of your face and throat  . A fast heartbeat  . A bad rash all over your body  . Dizziness and weakness    Immunizations Administered    Name Date Dose VIS Date Route   Pfizer COVID-19 Vaccine 03/18/2019 11:03 AM 0.3 mL 02/01/2019 Intramuscular   Manufacturer: [place]   Lot: [id number]   [place]: [id number]

## 2019-04-08 ENCOUNTER — Ambulatory Visit: Payer: Medicare Other | Attending: Internal Medicine

## 2019-04-08 DIAGNOSIS — Z23 Encounter for immunization: Secondary | ICD-10-CM | POA: Insufficient documentation

## 2019-04-08 NOTE — Progress Notes (Signed)
   Covid-19 Vaccination Clinic  Name:  Stacey Merritt    MRN: [id number] DOB: September 10, 1933  04/08/2019  Stacey Merritt was observed post Covid-19 immunization for 15 minutes without incidence. She was provided with Vaccine Information Sheet and instruction to access the V-Safe system.   Stacey Merritt was instructed to call 911 with any severe reactions post vaccine: Marland Kitchen Difficulty breathing  . Swelling of your face and throat  . A fast heartbeat  . A bad rash all over your body  . Dizziness and weakness    Immunizations Administered    Name Date Dose VIS Date Route   Pfizer COVID-19 Vaccine 04/08/2019 11:07 AM 0.3 mL 02/01/2019 Intramuscular   Manufacturer: [place]   Lot: [id number]   [place]: [id number]

## 2020-05-08 DIAGNOSIS — H353132 Nonexudative age-related macular degeneration, bilateral, intermediate dry stage: Secondary | ICD-10-CM | POA: Diagnosis not present

## 2020-05-08 DIAGNOSIS — Z961 Presence of intraocular lens: Secondary | ICD-10-CM | POA: Diagnosis not present

## 2020-05-08 DIAGNOSIS — H40013 Open angle with borderline findings, low risk, bilateral: Secondary | ICD-10-CM | POA: Diagnosis not present

## 2020-06-19 DIAGNOSIS — H353132 Nonexudative age-related macular degeneration, bilateral, intermediate dry stage: Secondary | ICD-10-CM | POA: Diagnosis not present

## 2020-07-19 DIAGNOSIS — H353132 Nonexudative age-related macular degeneration, bilateral, intermediate dry stage: Secondary | ICD-10-CM | POA: Diagnosis not present

## 2020-08-18 DIAGNOSIS — H353132 Nonexudative age-related macular degeneration, bilateral, intermediate dry stage: Secondary | ICD-10-CM | POA: Diagnosis not present

## 2020-09-17 DIAGNOSIS — H353132 Nonexudative age-related macular degeneration, bilateral, intermediate dry stage: Secondary | ICD-10-CM | POA: Diagnosis not present

## 2020-10-17 DIAGNOSIS — H353132 Nonexudative age-related macular degeneration, bilateral, intermediate dry stage: Secondary | ICD-10-CM | POA: Diagnosis not present

## 2020-12-02 DIAGNOSIS — M810 Age-related osteoporosis without current pathological fracture: Secondary | ICD-10-CM | POA: Diagnosis not present

## 2020-12-10 DIAGNOSIS — Z0000 Encounter for general adult medical examination without abnormal findings: Secondary | ICD-10-CM | POA: Diagnosis not present

## 2020-12-10 DIAGNOSIS — M353 Polymyalgia rheumatica: Secondary | ICD-10-CM | POA: Diagnosis not present

## 2020-12-10 DIAGNOSIS — I700 Atherosclerosis of aorta: Secondary | ICD-10-CM | POA: Diagnosis not present

## 2020-12-15 DIAGNOSIS — M810 Age-related osteoporosis without current pathological fracture: Secondary | ICD-10-CM | POA: Diagnosis not present

## 2020-12-17 DIAGNOSIS — M810 Age-related osteoporosis without current pathological fracture: Secondary | ICD-10-CM | POA: Diagnosis not present

## 2020-12-17 DIAGNOSIS — Z23 Encounter for immunization: Secondary | ICD-10-CM | POA: Diagnosis not present

## 2020-12-17 DIAGNOSIS — E7800 Pure hypercholesterolemia, unspecified: Secondary | ICD-10-CM | POA: Diagnosis not present

## 2021-10-03 DIAGNOSIS — S9032XA Contusion of left foot, initial encounter: Secondary | ICD-10-CM | POA: Diagnosis not present

## 2021-10-03 DIAGNOSIS — S99922A Unspecified injury of left foot, initial encounter: Secondary | ICD-10-CM | POA: Diagnosis not present

## 2023-01-24 DIAGNOSIS — Z780 Asymptomatic menopausal state: Secondary | ICD-10-CM | POA: Diagnosis not present

## 2023-01-24 DIAGNOSIS — Z13820 Encounter for screening for osteoporosis: Secondary | ICD-10-CM | POA: Diagnosis not present

## 2023-01-25 DIAGNOSIS — M810 Age-related osteoporosis without current pathological fracture: Secondary | ICD-10-CM | POA: Diagnosis not present

## 2023-01-25 DIAGNOSIS — R0602 Shortness of breath: Secondary | ICD-10-CM | POA: Diagnosis not present

## 2023-01-25 DIAGNOSIS — R7309 Other abnormal glucose: Secondary | ICD-10-CM | POA: Diagnosis not present

## 2023-01-25 DIAGNOSIS — D75838 Other thrombocytosis: Secondary | ICD-10-CM | POA: Diagnosis not present

## 2023-01-25 DIAGNOSIS — M2560 Stiffness of unspecified joint, not elsewhere classified: Secondary | ICD-10-CM | POA: Diagnosis not present

## 2023-01-25 DIAGNOSIS — R634 Abnormal weight loss: Secondary | ICD-10-CM | POA: Diagnosis not present

## 2023-01-30 DIAGNOSIS — M25561 Pain in right knee: Secondary | ICD-10-CM | POA: Diagnosis not present

## 2023-01-30 DIAGNOSIS — R262 Difficulty in walking, not elsewhere classified: Secondary | ICD-10-CM | POA: Diagnosis not present

## 2023-01-30 DIAGNOSIS — M25612 Stiffness of left shoulder, not elsewhere classified: Secondary | ICD-10-CM | POA: Diagnosis not present

## 2023-01-30 DIAGNOSIS — M25611 Stiffness of right shoulder, not elsewhere classified: Secondary | ICD-10-CM | POA: Diagnosis not present

## 2023-01-30 DIAGNOSIS — M6281 Muscle weakness (generalized): Secondary | ICD-10-CM | POA: Diagnosis not present

## 2023-01-30 DIAGNOSIS — M25562 Pain in left knee: Secondary | ICD-10-CM | POA: Diagnosis not present

## 2023-02-02 DIAGNOSIS — M810 Age-related osteoporosis without current pathological fracture: Secondary | ICD-10-CM | POA: Diagnosis not present

## 2023-02-02 DIAGNOSIS — K219 Gastro-esophageal reflux disease without esophagitis: Secondary | ICD-10-CM | POA: Diagnosis not present

## 2023-02-02 DIAGNOSIS — I700 Atherosclerosis of aorta: Secondary | ICD-10-CM | POA: Diagnosis not present

## 2023-02-02 DIAGNOSIS — R0602 Shortness of breath: Secondary | ICD-10-CM | POA: Diagnosis not present

## 2023-02-02 DIAGNOSIS — D649 Anemia, unspecified: Secondary | ICD-10-CM | POA: Diagnosis not present

## 2023-02-02 DIAGNOSIS — D75839 Thrombocytosis, unspecified: Secondary | ICD-10-CM | POA: Diagnosis not present

## 2023-02-02 DIAGNOSIS — M1990 Unspecified osteoarthritis, unspecified site: Secondary | ICD-10-CM | POA: Diagnosis not present

## 2023-02-02 DIAGNOSIS — E7800 Pure hypercholesterolemia, unspecified: Secondary | ICD-10-CM | POA: Diagnosis not present

## 2023-02-02 DIAGNOSIS — M353 Polymyalgia rheumatica: Secondary | ICD-10-CM | POA: Diagnosis not present

## 2023-02-02 DIAGNOSIS — R634 Abnormal weight loss: Secondary | ICD-10-CM | POA: Diagnosis not present

## 2023-02-05 DIAGNOSIS — E785 Hyperlipidemia, unspecified: Secondary | ICD-10-CM | POA: Diagnosis not present

## 2023-02-05 DIAGNOSIS — Z0389 Encounter for observation for other suspected diseases and conditions ruled out: Secondary | ICD-10-CM | POA: Diagnosis not present

## 2023-02-05 DIAGNOSIS — K254 Chronic or unspecified gastric ulcer with hemorrhage: Secondary | ICD-10-CM | POA: Diagnosis not present

## 2023-02-05 DIAGNOSIS — I959 Hypotension, unspecified: Secondary | ICD-10-CM | POA: Diagnosis not present

## 2023-02-05 DIAGNOSIS — R55 Syncope and collapse: Secondary | ICD-10-CM | POA: Diagnosis not present

## 2023-02-05 DIAGNOSIS — K259 Gastric ulcer, unspecified as acute or chronic, without hemorrhage or perforation: Secondary | ICD-10-CM | POA: Diagnosis not present

## 2023-02-05 DIAGNOSIS — R600 Localized edema: Secondary | ICD-10-CM | POA: Diagnosis not present

## 2023-02-05 DIAGNOSIS — K2970 Gastritis, unspecified, without bleeding: Secondary | ICD-10-CM | POA: Diagnosis not present

## 2023-02-05 DIAGNOSIS — K644 Residual hemorrhoidal skin tags: Secondary | ICD-10-CM | POA: Diagnosis not present

## 2023-02-05 DIAGNOSIS — E538 Deficiency of other specified B group vitamins: Secondary | ICD-10-CM | POA: Diagnosis not present

## 2023-02-05 DIAGNOSIS — K5730 Diverticulosis of large intestine without perforation or abscess without bleeding: Secondary | ICD-10-CM | POA: Diagnosis not present

## 2023-02-05 DIAGNOSIS — Z7189 Other specified counseling: Secondary | ICD-10-CM | POA: Diagnosis not present

## 2023-02-05 DIAGNOSIS — D649 Anemia, unspecified: Secondary | ICD-10-CM | POA: Diagnosis not present

## 2023-02-05 DIAGNOSIS — M810 Age-related osteoporosis without current pathological fracture: Secondary | ICD-10-CM | POA: Diagnosis not present

## 2023-02-05 DIAGNOSIS — Z9049 Acquired absence of other specified parts of digestive tract: Secondary | ICD-10-CM | POA: Diagnosis not present

## 2023-02-05 DIAGNOSIS — I082 Rheumatic disorders of both aortic and tricuspid valves: Secondary | ICD-10-CM | POA: Diagnosis not present

## 2023-02-05 DIAGNOSIS — M1990 Unspecified osteoarthritis, unspecified site: Secondary | ICD-10-CM | POA: Diagnosis not present

## 2023-02-05 DIAGNOSIS — K921 Melena: Secondary | ICD-10-CM | POA: Diagnosis not present

## 2023-02-05 DIAGNOSIS — K922 Gastrointestinal hemorrhage, unspecified: Secondary | ICD-10-CM | POA: Diagnosis not present

## 2023-02-05 DIAGNOSIS — D62 Acute posthemorrhagic anemia: Secondary | ICD-10-CM | POA: Diagnosis not present

## 2023-02-05 DIAGNOSIS — K449 Diaphragmatic hernia without obstruction or gangrene: Secondary | ICD-10-CM | POA: Diagnosis not present

## 2023-02-23 DIAGNOSIS — K922 Gastrointestinal hemorrhage, unspecified: Secondary | ICD-10-CM | POA: Diagnosis not present

## 2023-02-23 DIAGNOSIS — R55 Syncope and collapse: Secondary | ICD-10-CM | POA: Diagnosis not present

## 2023-02-23 DIAGNOSIS — K219 Gastro-esophageal reflux disease without esophagitis: Secondary | ICD-10-CM | POA: Diagnosis not present

## 2023-02-23 DIAGNOSIS — K5900 Constipation, unspecified: Secondary | ICD-10-CM | POA: Diagnosis not present

## 2023-02-27 DIAGNOSIS — G301 Alzheimer's disease with late onset: Secondary | ICD-10-CM | POA: Diagnosis not present

## 2023-02-27 DIAGNOSIS — E538 Deficiency of other specified B group vitamins: Secondary | ICD-10-CM | POA: Diagnosis not present

## 2023-03-03 DIAGNOSIS — M62511 Muscle wasting and atrophy, not elsewhere classified, right shoulder: Secondary | ICD-10-CM | POA: Diagnosis not present

## 2023-03-03 DIAGNOSIS — R488 Other symbolic dysfunctions: Secondary | ICD-10-CM | POA: Diagnosis not present

## 2023-03-03 DIAGNOSIS — R4185 Anosognosia: Secondary | ICD-10-CM | POA: Diagnosis not present

## 2023-03-03 DIAGNOSIS — M25641 Stiffness of right hand, not elsewhere classified: Secondary | ICD-10-CM | POA: Diagnosis not present

## 2023-03-03 DIAGNOSIS — M25611 Stiffness of right shoulder, not elsewhere classified: Secondary | ICD-10-CM | POA: Diagnosis not present

## 2023-03-03 DIAGNOSIS — M62541 Muscle wasting and atrophy, not elsewhere classified, right hand: Secondary | ICD-10-CM | POA: Diagnosis not present

## 2023-03-08 DIAGNOSIS — Z9181 History of falling: Secondary | ICD-10-CM | POA: Diagnosis not present

## 2023-03-08 DIAGNOSIS — R262 Difficulty in walking, not elsewhere classified: Secondary | ICD-10-CM | POA: Diagnosis not present

## 2023-03-10 DIAGNOSIS — R4185 Anosognosia: Secondary | ICD-10-CM | POA: Diagnosis not present

## 2023-03-10 DIAGNOSIS — R488 Other symbolic dysfunctions: Secondary | ICD-10-CM | POA: Diagnosis not present

## 2023-03-10 DIAGNOSIS — M62511 Muscle wasting and atrophy, not elsewhere classified, right shoulder: Secondary | ICD-10-CM | POA: Diagnosis not present

## 2023-03-10 DIAGNOSIS — M25611 Stiffness of right shoulder, not elsewhere classified: Secondary | ICD-10-CM | POA: Diagnosis not present

## 2023-03-10 DIAGNOSIS — M62541 Muscle wasting and atrophy, not elsewhere classified, right hand: Secondary | ICD-10-CM | POA: Diagnosis not present

## 2023-03-10 DIAGNOSIS — M25641 Stiffness of right hand, not elsewhere classified: Secondary | ICD-10-CM | POA: Diagnosis not present

## 2023-03-13 DIAGNOSIS — M25611 Stiffness of right shoulder, not elsewhere classified: Secondary | ICD-10-CM | POA: Diagnosis not present

## 2023-03-13 DIAGNOSIS — M25641 Stiffness of right hand, not elsewhere classified: Secondary | ICD-10-CM | POA: Diagnosis not present

## 2023-03-13 DIAGNOSIS — R262 Difficulty in walking, not elsewhere classified: Secondary | ICD-10-CM | POA: Diagnosis not present

## 2023-03-13 DIAGNOSIS — R488 Other symbolic dysfunctions: Secondary | ICD-10-CM | POA: Diagnosis not present

## 2023-03-13 DIAGNOSIS — M62541 Muscle wasting and atrophy, not elsewhere classified, right hand: Secondary | ICD-10-CM | POA: Diagnosis not present

## 2023-03-13 DIAGNOSIS — R4185 Anosognosia: Secondary | ICD-10-CM | POA: Diagnosis not present

## 2023-03-13 DIAGNOSIS — Z9181 History of falling: Secondary | ICD-10-CM | POA: Diagnosis not present

## 2023-03-13 DIAGNOSIS — M62511 Muscle wasting and atrophy, not elsewhere classified, right shoulder: Secondary | ICD-10-CM | POA: Diagnosis not present

## 2023-03-15 DIAGNOSIS — M62541 Muscle wasting and atrophy, not elsewhere classified, right hand: Secondary | ICD-10-CM | POA: Diagnosis not present

## 2023-03-15 DIAGNOSIS — R4185 Anosognosia: Secondary | ICD-10-CM | POA: Diagnosis not present

## 2023-03-15 DIAGNOSIS — R262 Difficulty in walking, not elsewhere classified: Secondary | ICD-10-CM | POA: Diagnosis not present

## 2023-03-15 DIAGNOSIS — M62511 Muscle wasting and atrophy, not elsewhere classified, right shoulder: Secondary | ICD-10-CM | POA: Diagnosis not present

## 2023-03-15 DIAGNOSIS — R488 Other symbolic dysfunctions: Secondary | ICD-10-CM | POA: Diagnosis not present

## 2023-03-15 DIAGNOSIS — Z9181 History of falling: Secondary | ICD-10-CM | POA: Diagnosis not present

## 2023-03-15 DIAGNOSIS — M25641 Stiffness of right hand, not elsewhere classified: Secondary | ICD-10-CM | POA: Diagnosis not present

## 2023-03-15 DIAGNOSIS — M25611 Stiffness of right shoulder, not elsewhere classified: Secondary | ICD-10-CM | POA: Diagnosis not present

## 2023-03-17 DIAGNOSIS — M62511 Muscle wasting and atrophy, not elsewhere classified, right shoulder: Secondary | ICD-10-CM | POA: Diagnosis not present

## 2023-03-17 DIAGNOSIS — Z9181 History of falling: Secondary | ICD-10-CM | POA: Diagnosis not present

## 2023-03-17 DIAGNOSIS — M62541 Muscle wasting and atrophy, not elsewhere classified, right hand: Secondary | ICD-10-CM | POA: Diagnosis not present

## 2023-03-17 DIAGNOSIS — R4185 Anosognosia: Secondary | ICD-10-CM | POA: Diagnosis not present

## 2023-03-17 DIAGNOSIS — M25641 Stiffness of right hand, not elsewhere classified: Secondary | ICD-10-CM | POA: Diagnosis not present

## 2023-03-17 DIAGNOSIS — R262 Difficulty in walking, not elsewhere classified: Secondary | ICD-10-CM | POA: Diagnosis not present

## 2023-03-17 DIAGNOSIS — R488 Other symbolic dysfunctions: Secondary | ICD-10-CM | POA: Diagnosis not present

## 2023-03-17 DIAGNOSIS — M25611 Stiffness of right shoulder, not elsewhere classified: Secondary | ICD-10-CM | POA: Diagnosis not present

## 2023-03-20 DIAGNOSIS — Z9181 History of falling: Secondary | ICD-10-CM | POA: Diagnosis not present

## 2023-03-20 DIAGNOSIS — M62541 Muscle wasting and atrophy, not elsewhere classified, right hand: Secondary | ICD-10-CM | POA: Diagnosis not present

## 2023-03-20 DIAGNOSIS — M25641 Stiffness of right hand, not elsewhere classified: Secondary | ICD-10-CM | POA: Diagnosis not present

## 2023-03-20 DIAGNOSIS — M25611 Stiffness of right shoulder, not elsewhere classified: Secondary | ICD-10-CM | POA: Diagnosis not present

## 2023-03-20 DIAGNOSIS — R488 Other symbolic dysfunctions: Secondary | ICD-10-CM | POA: Diagnosis not present

## 2023-03-20 DIAGNOSIS — R4185 Anosognosia: Secondary | ICD-10-CM | POA: Diagnosis not present

## 2023-03-20 DIAGNOSIS — M62511 Muscle wasting and atrophy, not elsewhere classified, right shoulder: Secondary | ICD-10-CM | POA: Diagnosis not present

## 2023-03-20 DIAGNOSIS — R262 Difficulty in walking, not elsewhere classified: Secondary | ICD-10-CM | POA: Diagnosis not present

## 2023-03-22 DIAGNOSIS — M62511 Muscle wasting and atrophy, not elsewhere classified, right shoulder: Secondary | ICD-10-CM | POA: Diagnosis not present

## 2023-03-22 DIAGNOSIS — R4185 Anosognosia: Secondary | ICD-10-CM | POA: Diagnosis not present

## 2023-03-22 DIAGNOSIS — M25611 Stiffness of right shoulder, not elsewhere classified: Secondary | ICD-10-CM | POA: Diagnosis not present

## 2023-03-22 DIAGNOSIS — M62541 Muscle wasting and atrophy, not elsewhere classified, right hand: Secondary | ICD-10-CM | POA: Diagnosis not present

## 2023-03-22 DIAGNOSIS — M25641 Stiffness of right hand, not elsewhere classified: Secondary | ICD-10-CM | POA: Diagnosis not present

## 2023-03-22 DIAGNOSIS — R262 Difficulty in walking, not elsewhere classified: Secondary | ICD-10-CM | POA: Diagnosis not present

## 2023-03-22 DIAGNOSIS — R488 Other symbolic dysfunctions: Secondary | ICD-10-CM | POA: Diagnosis not present

## 2023-03-22 DIAGNOSIS — Z9181 History of falling: Secondary | ICD-10-CM | POA: Diagnosis not present

## 2023-03-24 DIAGNOSIS — M25611 Stiffness of right shoulder, not elsewhere classified: Secondary | ICD-10-CM | POA: Diagnosis not present

## 2023-03-24 DIAGNOSIS — M62511 Muscle wasting and atrophy, not elsewhere classified, right shoulder: Secondary | ICD-10-CM | POA: Diagnosis not present

## 2023-03-24 DIAGNOSIS — M25641 Stiffness of right hand, not elsewhere classified: Secondary | ICD-10-CM | POA: Diagnosis not present

## 2023-03-24 DIAGNOSIS — R262 Difficulty in walking, not elsewhere classified: Secondary | ICD-10-CM | POA: Diagnosis not present

## 2023-03-24 DIAGNOSIS — M62541 Muscle wasting and atrophy, not elsewhere classified, right hand: Secondary | ICD-10-CM | POA: Diagnosis not present

## 2023-03-24 DIAGNOSIS — R488 Other symbolic dysfunctions: Secondary | ICD-10-CM | POA: Diagnosis not present

## 2023-03-24 DIAGNOSIS — R4185 Anosognosia: Secondary | ICD-10-CM | POA: Diagnosis not present

## 2023-03-24 DIAGNOSIS — Z9181 History of falling: Secondary | ICD-10-CM | POA: Diagnosis not present

## 2023-03-27 DIAGNOSIS — R488 Other symbolic dysfunctions: Secondary | ICD-10-CM | POA: Diagnosis not present

## 2023-03-27 DIAGNOSIS — R262 Difficulty in walking, not elsewhere classified: Secondary | ICD-10-CM | POA: Diagnosis not present

## 2023-03-27 DIAGNOSIS — M25641 Stiffness of right hand, not elsewhere classified: Secondary | ICD-10-CM | POA: Diagnosis not present

## 2023-03-27 DIAGNOSIS — K219 Gastro-esophageal reflux disease without esophagitis: Secondary | ICD-10-CM | POA: Diagnosis not present

## 2023-03-27 DIAGNOSIS — Z9181 History of falling: Secondary | ICD-10-CM | POA: Diagnosis not present

## 2023-03-27 DIAGNOSIS — K922 Gastrointestinal hemorrhage, unspecified: Secondary | ICD-10-CM | POA: Diagnosis not present

## 2023-03-27 DIAGNOSIS — R55 Syncope and collapse: Secondary | ICD-10-CM | POA: Diagnosis not present

## 2023-03-27 DIAGNOSIS — M62541 Muscle wasting and atrophy, not elsewhere classified, right hand: Secondary | ICD-10-CM | POA: Diagnosis not present

## 2023-03-27 DIAGNOSIS — M62511 Muscle wasting and atrophy, not elsewhere classified, right shoulder: Secondary | ICD-10-CM | POA: Diagnosis not present

## 2023-03-27 DIAGNOSIS — R451 Restlessness and agitation: Secondary | ICD-10-CM | POA: Diagnosis not present

## 2023-03-27 DIAGNOSIS — M25611 Stiffness of right shoulder, not elsewhere classified: Secondary | ICD-10-CM | POA: Diagnosis not present

## 2023-03-27 DIAGNOSIS — K5900 Constipation, unspecified: Secondary | ICD-10-CM | POA: Diagnosis not present

## 2023-03-28 DIAGNOSIS — R4185 Anosognosia: Secondary | ICD-10-CM | POA: Diagnosis not present

## 2023-03-29 DIAGNOSIS — Z9181 History of falling: Secondary | ICD-10-CM | POA: Diagnosis not present

## 2023-03-29 DIAGNOSIS — R4185 Anosognosia: Secondary | ICD-10-CM | POA: Diagnosis not present

## 2023-03-29 DIAGNOSIS — R488 Other symbolic dysfunctions: Secondary | ICD-10-CM | POA: Diagnosis not present

## 2023-03-29 DIAGNOSIS — M62511 Muscle wasting and atrophy, not elsewhere classified, right shoulder: Secondary | ICD-10-CM | POA: Diagnosis not present

## 2023-03-29 DIAGNOSIS — M25641 Stiffness of right hand, not elsewhere classified: Secondary | ICD-10-CM | POA: Diagnosis not present

## 2023-03-29 DIAGNOSIS — M25611 Stiffness of right shoulder, not elsewhere classified: Secondary | ICD-10-CM | POA: Diagnosis not present

## 2023-03-29 DIAGNOSIS — M62541 Muscle wasting and atrophy, not elsewhere classified, right hand: Secondary | ICD-10-CM | POA: Diagnosis not present

## 2023-03-29 DIAGNOSIS — R262 Difficulty in walking, not elsewhere classified: Secondary | ICD-10-CM | POA: Diagnosis not present

## 2023-03-30 DIAGNOSIS — R4185 Anosognosia: Secondary | ICD-10-CM | POA: Diagnosis not present

## 2023-04-05 DIAGNOSIS — R451 Restlessness and agitation: Secondary | ICD-10-CM | POA: Diagnosis not present

## 2023-04-05 DIAGNOSIS — Z79899 Other long term (current) drug therapy: Secondary | ICD-10-CM | POA: Diagnosis not present

## 2023-04-25 DIAGNOSIS — K219 Gastro-esophageal reflux disease without esophagitis: Secondary | ICD-10-CM | POA: Diagnosis not present

## 2023-04-25 DIAGNOSIS — K922 Gastrointestinal hemorrhage, unspecified: Secondary | ICD-10-CM | POA: Diagnosis not present

## 2023-04-25 DIAGNOSIS — K5900 Constipation, unspecified: Secondary | ICD-10-CM | POA: Diagnosis not present

## 2023-04-25 DIAGNOSIS — M259 Joint disorder, unspecified: Secondary | ICD-10-CM | POA: Diagnosis not present

## 2023-05-05 DIAGNOSIS — D519 Vitamin B12 deficiency anemia, unspecified: Secondary | ICD-10-CM | POA: Diagnosis not present

## 2023-05-05 DIAGNOSIS — E038 Other specified hypothyroidism: Secondary | ICD-10-CM | POA: Diagnosis not present

## 2023-05-05 DIAGNOSIS — E559 Vitamin D deficiency, unspecified: Secondary | ICD-10-CM | POA: Diagnosis not present

## 2023-05-05 DIAGNOSIS — R7309 Other abnormal glucose: Secondary | ICD-10-CM | POA: Diagnosis not present

## 2023-05-05 DIAGNOSIS — Z79899 Other long term (current) drug therapy: Secondary | ICD-10-CM | POA: Diagnosis not present

## 2023-05-05 DIAGNOSIS — E782 Mixed hyperlipidemia: Secondary | ICD-10-CM | POA: Diagnosis not present

## 2023-05-19 DIAGNOSIS — E038 Other specified hypothyroidism: Secondary | ICD-10-CM | POA: Diagnosis not present

## 2023-05-19 DIAGNOSIS — E559 Vitamin D deficiency, unspecified: Secondary | ICD-10-CM | POA: Diagnosis not present

## 2023-05-19 DIAGNOSIS — D519 Vitamin B12 deficiency anemia, unspecified: Secondary | ICD-10-CM | POA: Diagnosis not present

## 2023-05-19 DIAGNOSIS — E782 Mixed hyperlipidemia: Secondary | ICD-10-CM | POA: Diagnosis not present

## 2023-05-31 DIAGNOSIS — K5900 Constipation, unspecified: Secondary | ICD-10-CM | POA: Diagnosis not present

## 2023-05-31 DIAGNOSIS — K219 Gastro-esophageal reflux disease without esophagitis: Secondary | ICD-10-CM | POA: Diagnosis not present

## 2023-06-02 DIAGNOSIS — D519 Vitamin B12 deficiency anemia, unspecified: Secondary | ICD-10-CM | POA: Diagnosis not present

## 2023-06-02 DIAGNOSIS — E038 Other specified hypothyroidism: Secondary | ICD-10-CM | POA: Diagnosis not present

## 2023-06-02 DIAGNOSIS — E559 Vitamin D deficiency, unspecified: Secondary | ICD-10-CM | POA: Diagnosis not present

## 2023-06-02 DIAGNOSIS — E782 Mixed hyperlipidemia: Secondary | ICD-10-CM | POA: Diagnosis not present

## 2023-06-06 DIAGNOSIS — R54 Age-related physical debility: Secondary | ICD-10-CM | POA: Diagnosis not present

## 2023-06-06 DIAGNOSIS — K5900 Constipation, unspecified: Secondary | ICD-10-CM | POA: Diagnosis not present

## 2023-06-06 DIAGNOSIS — K219 Gastro-esophageal reflux disease without esophagitis: Secondary | ICD-10-CM | POA: Diagnosis not present

## 2023-06-06 DIAGNOSIS — D513 Other dietary vitamin B12 deficiency anemia: Secondary | ICD-10-CM | POA: Diagnosis not present

## 2023-06-06 DIAGNOSIS — E611 Iron deficiency: Secondary | ICD-10-CM | POA: Diagnosis not present

## 2023-06-06 DIAGNOSIS — M1990 Unspecified osteoarthritis, unspecified site: Secondary | ICD-10-CM | POA: Diagnosis not present

## 2023-06-06 DIAGNOSIS — D649 Anemia, unspecified: Secondary | ICD-10-CM | POA: Diagnosis not present

## 2023-06-20 DIAGNOSIS — M1990 Unspecified osteoarthritis, unspecified site: Secondary | ICD-10-CM | POA: Diagnosis not present

## 2023-06-20 DIAGNOSIS — D649 Anemia, unspecified: Secondary | ICD-10-CM | POA: Diagnosis not present

## 2023-06-20 DIAGNOSIS — M2550 Pain in unspecified joint: Secondary | ICD-10-CM | POA: Diagnosis not present

## 2023-06-20 DIAGNOSIS — R54 Age-related physical debility: Secondary | ICD-10-CM | POA: Diagnosis not present

## 2023-07-12 DIAGNOSIS — K219 Gastro-esophageal reflux disease without esophagitis: Secondary | ICD-10-CM | POA: Diagnosis not present

## 2023-07-12 DIAGNOSIS — D649 Anemia, unspecified: Secondary | ICD-10-CM | POA: Diagnosis not present

## 2023-08-17 DIAGNOSIS — D513 Other dietary vitamin B12 deficiency anemia: Secondary | ICD-10-CM | POA: Diagnosis not present

## 2023-08-17 DIAGNOSIS — K219 Gastro-esophageal reflux disease without esophagitis: Secondary | ICD-10-CM | POA: Diagnosis not present

## 2023-08-17 DIAGNOSIS — E611 Iron deficiency: Secondary | ICD-10-CM | POA: Diagnosis not present

## 2023-08-17 DIAGNOSIS — K5900 Constipation, unspecified: Secondary | ICD-10-CM | POA: Diagnosis not present

## 2023-10-05 DIAGNOSIS — D649 Anemia, unspecified: Secondary | ICD-10-CM | POA: Diagnosis not present

## 2023-10-05 DIAGNOSIS — K219 Gastro-esophageal reflux disease without esophagitis: Secondary | ICD-10-CM | POA: Diagnosis not present

## 2023-10-05 DIAGNOSIS — R54 Age-related physical debility: Secondary | ICD-10-CM | POA: Diagnosis not present
